# Patient Record
Sex: Male | Born: 1995
Health system: Southern US, Community
[De-identification: ages and names within clinical notes are randomized; demographics above are authoritative.]

## PROBLEM LIST (undated history)

## (undated) DIAGNOSIS — K649 Unspecified hemorrhoids: Secondary | ICD-10-CM

## (undated) DIAGNOSIS — K602 Anal fissure, unspecified: Secondary | ICD-10-CM

## (undated) DIAGNOSIS — Z789 Other specified health status: Secondary | ICD-10-CM

## (undated) DIAGNOSIS — L309 Dermatitis, unspecified: Secondary | ICD-10-CM

## (undated) HISTORY — DX: Dermatitis, unspecified: L30.9

## (undated) HISTORY — DX: Other specified health status: Z78.9

## (undated) HISTORY — DX: Anal fissure, unspecified: K60.2

## (undated) HISTORY — DX: Unspecified hemorrhoids: K64.9

---
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
## 2017-06-15 DIAGNOSIS — B356 Tinea cruris: Secondary | ICD-10-CM | POA: Diagnosis not present

## 2017-12-18 DIAGNOSIS — L304 Erythema intertrigo: Secondary | ICD-10-CM | POA: Diagnosis not present

## 2017-12-18 DIAGNOSIS — L700 Acne vulgaris: Secondary | ICD-10-CM | POA: Diagnosis not present

## 2017-12-18 DIAGNOSIS — L299 Pruritus, unspecified: Secondary | ICD-10-CM | POA: Diagnosis not present

## 2018-06-04 DIAGNOSIS — L219 Seborrheic dermatitis, unspecified: Secondary | ICD-10-CM | POA: Diagnosis not present

## 2018-07-16 DIAGNOSIS — R5383 Other fatigue: Secondary | ICD-10-CM | POA: Diagnosis not present

## 2018-07-16 DIAGNOSIS — K5909 Other constipation: Secondary | ICD-10-CM | POA: Diagnosis not present

## 2018-07-16 DIAGNOSIS — Z1331 Encounter for screening for depression: Secondary | ICD-10-CM | POA: Diagnosis not present

## 2018-07-16 DIAGNOSIS — K921 Melena: Secondary | ICD-10-CM | POA: Diagnosis not present

## 2018-07-16 DIAGNOSIS — R42 Dizziness and giddiness: Secondary | ICD-10-CM | POA: Diagnosis not present

## 2018-09-17 ENCOUNTER — Encounter: Payer: Self-pay | Admitting: Gastroenterology

## 2018-11-12 ENCOUNTER — Other Ambulatory Visit (INDEPENDENT_AMBULATORY_CARE_PROVIDER_SITE_OTHER): Payer: BLUE CROSS/BLUE SHIELD

## 2018-11-12 ENCOUNTER — Encounter: Payer: Self-pay | Admitting: Gastroenterology

## 2018-11-12 ENCOUNTER — Ambulatory Visit (INDEPENDENT_AMBULATORY_CARE_PROVIDER_SITE_OTHER): Payer: BLUE CROSS/BLUE SHIELD | Admitting: Gastroenterology

## 2018-11-12 VITALS — BP 110/68 | HR 62 | Ht 68.0 in | Wt 152.1 lb

## 2018-11-12 DIAGNOSIS — R748 Abnormal levels of other serum enzymes: Secondary | ICD-10-CM

## 2018-11-12 DIAGNOSIS — R21 Rash and other nonspecific skin eruption: Secondary | ICD-10-CM

## 2018-11-12 DIAGNOSIS — R194 Change in bowel habit: Secondary | ICD-10-CM

## 2018-11-12 DIAGNOSIS — K602 Anal fissure, unspecified: Secondary | ICD-10-CM

## 2018-11-12 DIAGNOSIS — H1045 Other chronic allergic conjunctivitis: Secondary | ICD-10-CM | POA: Diagnosis not present

## 2018-11-12 LAB — CBC WITH DIFFERENTIAL/PLATELET
BASOS ABS: 0 10*3/uL (ref 0.0–0.1)
BASOS PCT: 0.7 % (ref 0.0–3.0)
EOS ABS: 0.2 10*3/uL (ref 0.0–0.7)
Eosinophils Relative: 4.2 % (ref 0.0–5.0)
HEMATOCRIT: 49.2 % (ref 39.0–52.0)
HEMOGLOBIN: 16.6 g/dL (ref 13.0–17.0)
LYMPHS PCT: 34 % (ref 12.0–46.0)
Lymphs Abs: 1.9 10*3/uL (ref 0.7–4.0)
MCHC: 33.8 g/dL (ref 30.0–36.0)
MCV: 87.8 fl (ref 78.0–100.0)
MONOS PCT: 10.9 % (ref 3.0–12.0)
Monocytes Absolute: 0.6 10*3/uL (ref 0.1–1.0)
Neutro Abs: 2.8 10*3/uL (ref 1.4–7.7)
Neutrophils Relative %: 50.2 % (ref 43.0–77.0)
Platelets: 283 10*3/uL (ref 150.0–400.0)
RBC: 5.6 Mil/uL (ref 4.22–5.81)
RDW: 13.3 % (ref 11.5–15.5)
WBC: 5.6 10*3/uL (ref 4.0–10.5)

## 2018-11-12 LAB — HEPATIC FUNCTION PANEL
ALT: 44 U/L (ref 0–53)
AST: 34 U/L (ref 0–37)
Albumin: 4.8 g/dL (ref 3.5–5.2)
Alkaline Phosphatase: 54 U/L (ref 39–117)
BILIRUBIN DIRECT: 0.1 mg/dL (ref 0.0–0.3)
BILIRUBIN TOTAL: 0.6 mg/dL (ref 0.2–1.2)
TOTAL PROTEIN: 7.2 g/dL (ref 6.0–8.3)

## 2018-11-12 MED ORDER — HYDROCORTISONE 1 % EX OINT: 1.0000 "application " | TOPICAL_OINTMENT | Freq: Two times a day (BID) | CUTANEOUS | 0 refills | Status: DC

## 2018-11-12 MED ORDER — AMBULATORY NON FORMULARY MEDICATION: 0 refills | Status: DC

## 2018-11-12 MED ORDER — METHYLCELLULOSE (LAXATIVE) PO POWD: ORAL | Status: DC

## 2018-11-12 NOTE — Patient Instructions (Addendum)
If you are age 21 or older, your body mass index should be between 23-30. Your Body mass index is 23.13 kg/m. If this is out of the aforementioned range listed, please consider follow up with your Primary Care Provider.  If you are age 57 or younger, your body mass index should be between 19-25. Your Body mass index is 23.13 kg/m. If this is out of the aformentioned range listed, please consider follow up with your Primary Care Provider.   Use a daily fiber supplement such as Citrucel, over the counter.  Purchase hydrocortisone 1% cream over the counter and use twice a day.  Follow up with Dermatology.  We have sent a prescription for nitroglycerin 0.125% gel to [facility]. You should apply a pea size amount to your rectum three times daily x 6-8 weeks.  [facility]'s information is below: Address: [address] [place], [place] [address]  [phone]  *Please DO NOT go directly from our office to pick up this medication! Give the pharmacy 1 day to process the prescription as this is compounded at takes time to make.  Please go to the lab in the basement of our building to have lab work done as you leave today. Hit "B" for basement when you get on the elevator.  When the doors open the lab is on your left.  We will call you with the results. Thank you.  Call us in 2 weeks if not improved.   Thank you for entrusting me with your care and for choosing [facility], Dr. Esmond Cellar

## 2018-11-12 NOTE — Progress Notes (Signed)
HPI :  22 y/o male with no significant medical history referred by Charlott Holler FNP for symptoms of rectal bleeding and constipation. He is accompanied by his father today. Labs from July 31st done for this issue - Hgb 14.2, ferritin of 89.  AST of 84, ALT 86, 65, T bil 0.7 WBC 4.8, platelets 276, normal renal function  He reports for the past year he has been having some scant blood with bowel movement. More recently this has occurred with most of his bowel movements, small amount of bright red blood, he also has associated discomfort in his anal canal with this when passing his stools. His stool is sometimes hard, sometimes normal, sometimes soft. He's had constipation in the past for which she is used some Colace. He has some occasional rare lower abdominal discomfort unclear if related to a bowel movement. He denies any nausea or vomiting, no weight loss, no new medications. He reports he's been seen by dermatology for blistering skin reaction at times, unclear over the past few years what is been causing that, also associated is a rash he has had on his lower back and buttocks. He denies any dietary changes. 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 He denies any family history of colon cancer or IBD. He has never had a prior colonoscopy.   Of note on routine lab work over the summer he was noted to have AST and ALT elevation. He denies any family history of liver disease. No personal history of liver disease. No history of jaundice. He does not drink any alcohol. No tobacco use. He is otherwise healthy and other than an appendectomy as a child has no significant surgical history.    Past Medical History:  Diagnosis Date  . No pertinent past surgical history      Past Surgical History:  Procedure Laterality Date  . APPENDECTOMY  2006   Family History  Problem Relation Age of Onset  . Hypertension Mother   . Hyperlipidemia Mother   . Thyroid disease Mother   .  Hypertension Father   . Non-Hodgkin's lymphoma Paternal Uncle    Social History   Tobacco Use  . Smoking status: Never Smoker  . Smokeless tobacco: Never Used  Substance Use Topics  . Alcohol use: Never    Frequency: Never  . Drug use: Never   Current Outpatient Medications  Medication Sig Dispense Refill  . Multiple Vitamin (MULTIVITAMIN) tablet Take 1 tablet by mouth daily.    . AMBULATORY NON FORMULARY MEDICATION Nitroglycerine ointment 0.125 %  Apply a pea sized amount internally two - three times daily for several weeks or until healed Dispense 30 GM zero refill 30 g 0  . hydrocortisone 1 % ointment Apply 1 application topically 2 (two) times daily. 30 g 0  . methylcellulose (CITRUCEL) oral powder Use daily  As directed     No current facility-administered medications for this visit.    No Known Allergies   Review of Systems: All systems reviewed and negative except where noted in HPI.    No results found.  Physical Exam: BP 110/68   Pulse 62   Ht 5\' 8"  (1.727 m)   Wt 152 lb 2 oz (69 kg)   BMI 23.13 kg/m  Constitutional: Pleasant,well-developed, male in no acute distress. HEENT: Normocephalic and atraumatic. Conjunctivae are normal. No scleral icterus. Neck supple.  Cardiovascular: Normal rate, regular rhythm.  Pulmonary/chest: Effort normal and breath sounds normal. No wheezing, rales or  rhonchi. Abdominal: Soft, nondistended, nontender. . There are no masses palpable. No hepatomegaly. DRE - moderate posterior midline anal fissure, normal DRE otherwise - anoscopy deferred due to fissure and pain. Eczematous rash on lower back and buttocks Extremities: no edema Lymphadenopathy: No cervical adenopathy noted. Neurological: Alert and oriented to person place and time. Skin: Skin is warm and dry. No rashes noted. Psychiatric: Normal mood and affect. Behavior is normal.   ASSESSMENT AND PLAN: 22 y/o male here for new patient assessment of the following  issues:  Anal Fissure / altered bowel habits - rectal discomfort with bleeding for the past year. His CBC is normal. On exam he has an obvious anal fissure which correlates to location of his pain has suspect this is the very likely cause for his bleeding. Anoscopy was deferred today in light of this finding and his pain with palpation. Recommending he use a daily fiber supplement to help keep stool soft and avoid time spent on the toilet. We'll treat the fissure with topical 0.125% nitroglycerin ointment 2 to 3 times a day for the next few weeks or until healed. I will recheck a CBC today to ensure its remained stable. I asked him to contact me in a few weeks and let me know how he is doing. If he fails to have improvement of symptoms but the fissure resolved, would then consider colonoscopy. I suspect therapy however will likely resolve his symptoms. He verbalizes understanding to contact me if he is not better.  Elevated liver enzymes - one time elevation over the summer. I will repeat LFTs at this time and see if elevation persists if it does, he will warrant further serologic workup and ultrasound imaging. He agreed with the plan  Rash - appears to have an eczematous rash on his lower back and buttocks area. No obvious blisters which he reports in the past. He has follow with dermatology for this. We'll try some topical 1% hydrocortisone over-the-counter for this and recommend he follow up with dermatology for further assessment.  I explained all of these issues with the patient and his father today who verbalized understanding, I answered their questions.  Parkline Cellar, MD [facility]  CC: Charlott Holler FNP

## 2018-12-04 DIAGNOSIS — L300 Nummular dermatitis: Secondary | ICD-10-CM | POA: Diagnosis not present

## 2018-12-04 DIAGNOSIS — L299 Pruritus, unspecified: Secondary | ICD-10-CM | POA: Diagnosis not present

## 2019-01-18 ENCOUNTER — Telehealth: Payer: Self-pay | Admitting: Gastroenterology

## 2019-01-18 DIAGNOSIS — K625 Hemorrhage of anus and rectum: Secondary | ICD-10-CM

## 2019-01-18 DIAGNOSIS — R194 Change in bowel habit: Secondary | ICD-10-CM

## 2019-01-18 NOTE — Telephone Encounter (Signed)
Called and spoke to pt's father.  Pt is at college in [place] having BR blood in toilet. He is taking the Citrucel daily and using Nitroglycerin several times a day as directed at his last office visit. He is having stabbing abdominal pain. Spoke to Dr. Loni Muse.  He is recommending a colonoscopy. I direct-booked him for a colonoscopy next Wednesday. Jimmy Wells, the father, will come in on Monday to go through pt's prep instructions and pick up a sample prep. Father understands that Jimmy Wells should be seen locally if his condition worsens over the weekend.

## 2019-01-18 NOTE — Telephone Encounter (Signed)
Agree with assessment and plan. He is being treated for fissure with niroglycerin and symptoms have persisted. Recommend colonoscopy to further evaluate. Thanks for coordinating.

## 2019-01-18 NOTE — Telephone Encounter (Signed)
PT father called advised that son is still having GI sympotomes and though if there was another med he could maybe take... would like to talk to someone.Marland KitchenMarland KitchenMarland Kitchen

## 2019-01-21 NOTE — Telephone Encounter (Signed)
Father, Jimmy Wells came in and was instructed. Sample of Suprep given.  Answered all questions.

## 2019-01-23 ENCOUNTER — Encounter: Payer: BLUE CROSS/BLUE SHIELD | Admitting: Gastroenterology

## 2019-01-23 ENCOUNTER — Encounter: Payer: Self-pay | Admitting: Gastroenterology

## 2019-01-23 ENCOUNTER — Ambulatory Visit (AMBULATORY_SURGERY_CENTER): Payer: BLUE CROSS/BLUE SHIELD | Admitting: Gastroenterology

## 2019-01-23 VITALS — BP 106/68 | HR 66 | Temp 98.0°F | Resp 11 | Ht 68.0 in | Wt 152.0 lb

## 2019-01-23 DIAGNOSIS — K648 Other hemorrhoids: Secondary | ICD-10-CM

## 2019-01-23 DIAGNOSIS — D125 Benign neoplasm of sigmoid colon: Secondary | ICD-10-CM | POA: Diagnosis not present

## 2019-01-23 DIAGNOSIS — K635 Polyp of colon: Secondary | ICD-10-CM | POA: Diagnosis not present

## 2019-01-23 DIAGNOSIS — K625 Hemorrhage of anus and rectum: Secondary | ICD-10-CM

## 2019-01-23 MED ORDER — SODIUM CHLORIDE 0.9 % IV SOLN: 500.0000 mL | Freq: Once | INTRAVENOUS | Status: DC

## 2019-01-23 NOTE — Op Note (Signed)
[facility] Patient Name: Jimmy Wells Procedure Date: 01/23/2019 11:03 AM MRN: [id number] Endoscopist: Remo Lipps P. Havery Moros , MD Age: 23 Referring MD:  Date of Birth: 04-26-1996 Gender: Male Account #: [id number] Procedure:                Colonoscopy Indications:              Rectal bleeding Medicines:                Monitored Anesthesia Care Procedure:                Pre-Anesthesia Assessment:                           - Prior to the procedure, a History and Physical                            was performed, and patient medications and                            allergies were reviewed. The patient's tolerance of                            previous anesthesia was also reviewed. The risks                            and benefits of the procedure and the sedation                            options and risks were discussed with the patient.                            All questions were answered, and informed consent                            was obtained. Prior Anticoagulants: The patient has                            taken no previous anticoagulant or antiplatelet                            agents. ASA Grade Assessment: I - A normal, healthy                            patient. After reviewing the risks and benefits,                            the patient was deemed in satisfactory condition to                            undergo the procedure.                           After obtaining informed consent, the colonoscope  was passed under direct vision. Throughout the                            procedure, the patient's blood pressure, pulse, and                            oxygen saturations were monitored continuously. The                            Colonoscope was introduced through the anus and                            advanced to the the terminal ileum, with                            identification of the appendiceal orifice and IC              valve. The colonoscopy was performed without                            difficulty. The patient tolerated the procedure                            well. The quality of the bowel preparation was                            good. The terminal ileum, ileocecal valve,                            appendiceal orifice, and rectum were photographed. Scope In: 11:10:02 AM Scope Out: 11:33:23 AM Scope Withdrawal Time: 0 hours 17 minutes 36 seconds  Total Procedure Duration: 0 hours 23 minutes 21 seconds  Findings:                 An anal fissure was found on perianal exam.                           The terminal ileum appeared normal.                           A 3 mm polyp was found in the sigmoid colon. The                            polyp was flat. The polyp was removed with a cold                            snare. Resection and retrieval were complete.                           The colon was quite tortuous.                           Internal hemorrhoids were found during  retroflexion. The hemorrhoids were small.                           The exam was otherwise without abnormality. Complications:            No immediate complications. Estimated blood loss:                            Minimal. Estimated Blood Loss:     Estimated blood loss was minimal. Impression:               - Anal fissure found on perianal exam.                           - The examined portion of the ileum was normal.                           - One 3 mm polyp in the sigmoid colon, removed with                            a cold snare. Resected and retrieved.                           - Tortuous colon.                           - Internal hemorrhoids.                           - The examination was otherwise normal.                           Overall, suspect fissure is the cause of patient's                            symptoms, +/- internal hemorrhoids. Recommendation:           - Patient has  a contact number available for                            emergencies. The signs and symptoms of potential                            delayed complications were discussed with the                            patient. Return to normal activities tomorrow.                            Written discharge instructions were provided to the                            patient.                           - Resume previous diet.                           -  Continue present medications.                           - Topical nitroglycerin 0.125% applied three times                            daily if not already doing that                           - Daily fiber supplement                           - Call if no improvement in symptoms                           - Await pathology results. Remo Lipps P. Havery Moros, MD 01/23/2019 11:38:58 AM This report has been signed electronically.

## 2019-01-23 NOTE — Progress Notes (Signed)
A/ox3, pleased with MAC, report to RN 

## 2019-01-23 NOTE — Patient Instructions (Signed)
Handouts given on polyps and hemorrhoids   YOU HAD AN ENDOSCOPIC PROCEDURE TODAY AT THE [facility]:   Refer to the procedure report that was given to you for any specific questions about what was found during the examination.  If the procedure report does not answer your questions, please call your gastroenterologist to clarify.  If you requested that your care partner not be given the details of your procedure findings, then the procedure report has been included in a sealed envelope for you to review at your convenience later.  YOU SHOULD EXPECT: Some feelings of bloating in the abdomen. Passage of more gas than usual.  Walking can help get rid of the air that was put into your GI tract during the procedure and reduce the bloating. If you had a lower endoscopy (such as a colonoscopy or flexible sigmoidoscopy) you may notice spotting of blood in your stool or on the toilet paper. If you underwent a bowel prep for your procedure, you may not have a normal bowel movement for a few days.  Please Note:  You might notice some irritation and congestion in your nose or some drainage.  This is from the oxygen used during your procedure.  There is no need for concern and it should clear up in a day or so.  SYMPTOMS TO REPORT IMMEDIATELY:   Following lower endoscopy (colonoscopy or flexible sigmoidoscopy):  Excessive amounts of blood in the stool  Significant tenderness or worsening of abdominal pains  Swelling of the abdomen that is new, acute  Fever of 100F or higher    For urgent or emergent issues, a gastroenterologist can be reached at any hour by calling [phone].   DIET:  We do recommend a small meal at first, but then you may proceed to your regular diet.  Drink plenty of fluids but you should avoid alcoholic beverages for 24 hours.  ACTIVITY:  You should plan to take it easy for the rest of today and you should NOT DRIVE or use heavy machinery until tomorrow (because of  the sedation medicines used during the test).    FOLLOW UP: Our staff will call the number listed on your records the next business day following your procedure to check on you and address any questions or concerns that you may have regarding the information given to you following your procedure. If we do not reach you, we will leave a message.  However, if you are feeling well and you are not experiencing any problems, there is no need to return our call.  We will assume that you have returned to your regular daily activities without incident.  If any biopsies were taken you will be contacted by phone or by letter within the next 1-3 weeks.  Please call us at [phone] if you have not heard about the biopsies in 3 weeks.    SIGNATURES/CONFIDENTIALITY: You and/or your care partner have signed paperwork which will be entered into your electronic medical record.  These signatures attest to the fact that that the information above on your After Visit Summary has been reviewed and is understood.  Full responsibility of the confidentiality of this discharge information lies with you and/or your care-partner. 

## 2019-01-23 NOTE — Progress Notes (Signed)
Called to room to assist during endoscopic procedure.  Patient ID and intended procedure confirmed with present staff. Received instructions for my participation in the procedure from the performing physician.  

## 2019-01-24 ENCOUNTER — Telehealth: Payer: Self-pay

## 2019-01-24 NOTE — Telephone Encounter (Signed)
  Follow up Call-  Call back number 01/23/2019  Post procedure Call Back phone  # [phone]  Permission to leave phone message Yes  Some recent data might be hidden     Patient questions:  Do you have a fever, pain , or abdominal swelling? No. Pain Score  0 *  Have you tolerated food without any problems? Yes.    Have you been able to return to your normal activities? Yes.    Do you have any questions about your discharge instructions: Diet   No. Medications  No. Follow up visit  No.  Do you have questions or concerns about your Care? No.  Actions: * If pain score is 4 or above: No action needed, pain <4.

## 2019-02-02 ENCOUNTER — Encounter: Payer: Self-pay | Admitting: Gastroenterology

## 2019-02-08 ENCOUNTER — Ambulatory Visit: Payer: BLUE CROSS/BLUE SHIELD | Admitting: Gastroenterology

## 2019-02-08 DIAGNOSIS — B353 Tinea pedis: Secondary | ICD-10-CM | POA: Diagnosis not present

## 2019-08-22 DIAGNOSIS — R0602 Shortness of breath: Secondary | ICD-10-CM | POA: Diagnosis not present

## 2019-08-22 DIAGNOSIS — Z1159 Encounter for screening for other viral diseases: Secondary | ICD-10-CM | POA: Diagnosis not present

## 2019-09-17 DIAGNOSIS — L299 Pruritus, unspecified: Secondary | ICD-10-CM | POA: Diagnosis not present

## 2019-09-17 DIAGNOSIS — L300 Nummular dermatitis: Secondary | ICD-10-CM | POA: Diagnosis not present

## 2019-11-20 DIAGNOSIS — M79674 Pain in right toe(s): Secondary | ICD-10-CM | POA: Diagnosis not present

## 2019-11-20 DIAGNOSIS — Z6826 Body mass index (BMI) 26.0-26.9, adult: Secondary | ICD-10-CM | POA: Diagnosis not present

## 2019-11-20 DIAGNOSIS — Z2821 Immunization not carried out because of patient refusal: Secondary | ICD-10-CM | POA: Diagnosis not present

## 2019-12-11 ENCOUNTER — Ambulatory Visit (INDEPENDENT_AMBULATORY_CARE_PROVIDER_SITE_OTHER): Payer: BC Managed Care – PPO | Admitting: Gastroenterology

## 2019-12-11 ENCOUNTER — Encounter: Payer: Self-pay | Admitting: Gastroenterology

## 2019-12-11 VITALS — BP 112/58 | HR 64 | Temp 97.9°F | Ht 67.5 in | Wt 171.2 lb

## 2019-12-11 DIAGNOSIS — K602 Anal fissure, unspecified: Secondary | ICD-10-CM

## 2019-12-11 MED ORDER — NONFORMULARY OR COMPOUNDED ITEM: 1 refills | Status: DC

## 2019-12-11 NOTE — Patient Instructions (Signed)
If you are age 22 or older, your body mass index should be between 23-30. Your Body mass index is 26.42 kg/m. If this is out of the aforementioned range listed, please consider follow up with your Primary Care Provider.  If you are age 14 or younger, your body mass index should be between 19-25. Your Body mass index is 26.42 kg/m. If this is out of the aformentioned range listed, please consider follow up with your Primary Care Provider.   We have sent the following medications to your pharmacy for you to pick up at your convenience: Diltiazem Gel/Lidocaine compound - [facility].

## 2019-12-11 NOTE — Progress Notes (Signed)
HPI :  23 year old male here for follow-up visit.  I have seen him in the past for rectal bleeding which has been thought to be due to anal fissure.  Since her last visit he underwent a colonoscopy on 01/23/2019 as below   Colonoscopy - 01/23/19  - An anal fissure was found on perianal exam. - The terminal ileum appeared normal. - A 3 mm polyp was found in the sigmoid colon. The polyp was flat. The polyp was removed with a cold snare. Resection and retrieval were complete. - HYPERPLASTIC - The colon was quite tortuous. - Internal hemorrhoids were found during retroflexion. The hemorrhoids were small. - The exam was otherwise without abnormality.  Overall at the time it was thought that his symptoms were due to persistent anal fissure.  I had recommended topical nitroglycerin ointment and the use of fiber supplement such as Citrucel to keep the stool soft. He use the nitroglycerin in its entirety, he has been maintaining his use of Citrucel and Colace to keep the stools soft.  He denies any straining and constipation and in general the symptoms are much better than they had been in the previous year.  He continues to have episodes of the fissure flaring up about 3-4 times a year.  The last time was about a month ago, he had a few bowel movements with some blood and perianal pain.  He denies any abdominal pains otherwise.  He asked about long-term management options.  Otherwise feels well today without complaints     Past Medical History:  Diagnosis Date  . Anal fissure   . Eczema   . Hemorrhoids      Past Surgical History:  Procedure Laterality Date  . APPENDECTOMY  2006   Family History  Problem Relation Age of Onset  . Hypertension Mother   . Hyperlipidemia Mother   . Thyroid disease Mother   . Hypertension Father   . Non-Hodgkin's lymphoma Paternal Uncle    Social History   Tobacco Use  . Smoking status: Never Smoker  . Smokeless tobacco: Never Used  Substance Use Topics   . Alcohol use: Never  . Drug use: Never   Current Outpatient Medications  Medication Sig Dispense Refill  . Docusate Sodium (COLACE PO) Take 1 tablet by mouth as needed.    . EUCRISA 2 % OINT Apply 1 application topically 2 (two) times daily.    . Multiple Vitamin (MULTIVITAMIN) tablet Take 1 tablet by mouth daily.    . NONFORMULARY OR COMPOUNDED ITEM Diltiazem 2%:Lidocaine 5% compound - Apply a pea size amount to rectal opening three times daily for 6 weeks. 1 each 1   No current facility-administered medications for this visit.   No Known Allergies   Review of Systems: All systems reviewed and negative except where noted in HPI.   Lab Results  Component Value Date   WBC 5.6 11/12/2018   HGB 16.6 11/12/2018   HCT 49.2 11/12/2018   MCV 87.8 11/12/2018   PLT 283.0 11/12/2018    No results found for: CREATININE, BUN, NA, K, CL, CO2  Lab Results  Component Value Date   ALT 44 11/12/2018   AST 34 11/12/2018   ALKPHOS 54 11/12/2018   BILITOT 0.6 11/12/2018     Physical Exam: BP (!) 112/58   Pulse 64   Temp 97.9 F (36.6 C)   Ht 5' 7.5" (1.715 m)   Wt 171 lb 3.2 oz (77.7 kg)   BMI 26.42 kg/m  Constitutional:  Pleasant,well-developed, male in no acute distress. HEENT: Normocephalic and atraumatic. Conjunctivae are normal. No scleral icterus. Neck supple.  Cardiovascular: Normal rate, regular rhythm.  Pulmonary/chest: Effort normal and breath sounds normal. No wheezing, rales or rhonchi. Abdominal: Soft, nondistended, nontender. There are no masses palpable. No hepatomegaly. DRE - Eros standby - small posterior midline anal fissure - mostly healed, improved from when last seen. Internal DRE not performed Extremities: no edema Lymphadenopathy: No cervical adenopathy noted. Neurological: Alert and oriented to person place and time. Skin: Skin is warm and dry. No rashes noted. Psychiatric: Normal mood and affect. Behavior is normal.   ASSESSMENT AND  PLAN: 23 year old male here for follow-up visit:  Anal fissure - I believe this is most likely the cause for his symptoms.  He has had recurrent symptoms periodically, although over the past year is much better controlled with fiber supplementation compared to the previous year.  He has a very small, mostly healed fissure where he has had a previously noted fissure in the posterior midline anal canal on exam today.  I discussed options with him.  We will try course of diltiazem/lidocaine ointment to use 3 times a day for the next 6 weeks see if we can completely heal the fissure (previously tried nitroglycerin).  He should continue his fiber supplementation to keep stools soft and prevent straining.  Management thereafter will be based on how often this bothers him.  If he has symptoms very rarely and normally does well with just fiber then I would continue the regimen and hopefully nothing else is needed.  If he has recurrence of symptoms frequently that severely bother him despite medical management then we will need to consider a surgical evaluation.  We discussed this and he wants to hold off for now, wants to avoid surgery if at all possible which I agree with.  We will see how he does.  Moving forward again he will let me know if symptoms persist.  I do think the fissure is the source of his symptoms, his hemorrhoids were small and I think less likely to be causing his pain and bleeding.  We discussed that hemorrhoid banding could make the fissures worse and I would hold off on that for now.  He agreed and can follow-up as needed  Kersey Cellar, MD [facility]

## 2020-07-14 ENCOUNTER — Ambulatory Visit (INDEPENDENT_AMBULATORY_CARE_PROVIDER_SITE_OTHER): Payer: BC Managed Care – PPO | Admitting: Legal Medicine

## 2020-07-14 ENCOUNTER — Other Ambulatory Visit: Payer: Self-pay

## 2020-07-14 ENCOUNTER — Encounter: Payer: Self-pay | Admitting: Legal Medicine

## 2020-07-14 DIAGNOSIS — N50811 Right testicular pain: Secondary | ICD-10-CM | POA: Diagnosis not present

## 2020-07-14 DIAGNOSIS — N4400 Torsion of testis, unspecified: Secondary | ICD-10-CM | POA: Diagnosis not present

## 2020-07-14 DIAGNOSIS — R1031 Right lower quadrant pain: Secondary | ICD-10-CM | POA: Insufficient documentation

## 2020-07-14 DIAGNOSIS — N433 Hydrocele, unspecified: Secondary | ICD-10-CM | POA: Diagnosis not present

## 2020-07-14 LAB — POCT URINALYSIS DIP (CLINITEK)
Bilirubin, UA: NEGATIVE
Blood, UA: NEGATIVE
Glucose, UA: NEGATIVE mg/dL
Ketones, POC UA: NEGATIVE mg/dL
Leukocytes, UA: NEGATIVE
Nitrite, UA: NEGATIVE
POC PROTEIN,UA: NEGATIVE
Spec Grav, UA: 1.01 (ref 1.010–1.025)
Urobilinogen, UA: 0.2 E.U./dL
pH, UA: 6.5 (ref 5.0–8.0)

## 2020-07-14 NOTE — Progress Notes (Signed)
Subjective:  Patient ID: Jimmy Wells, male    DOB: 1996-07-23  Age: 24 y.o. MRN: [id number]  Chief Complaint  Patient presents with  . Testicle Pain    HPI: one week ago he has severe right testicular pain but went away.  He started same pain this AM for a few hours. He has nausea, no vomiting.  He is not doubled over in pain.  He lifts at gym up to 80 lbs.and 80 to 100 reps. He denies and dysuria or frequency.  No testicular trauma.   Current Outpatient Medications on File Prior to Visit  Medication Sig Dispense Refill  . Docusate Sodium (COLACE PO) Take 1 tablet by mouth as needed.    . EUCRISA 2 % OINT Apply 1 application topically 2 (two) times daily.    . Multiple Vitamin (MULTIVITAMIN) tablet Take 1 tablet by mouth daily.     No current facility-administered medications on file prior to visit.   Past Medical History:  Diagnosis Date  . Anal fissure   . Eczema   . Hemorrhoids    Past Surgical History:  Procedure Laterality Date  . APPENDECTOMY  2006    Family History  Problem Relation Age of Onset  . Hypertension Mother   . Hyperlipidemia Mother   . Thyroid disease Mother   . Hypertension Father   . Non-Hodgkin's lymphoma Paternal Uncle    Social History   Socioeconomic History  . Marital status: Single    Spouse name: Not on file  . Number of children: Not on file  . Years of education: Not on file  . Highest education level: Not on file  Occupational History  . Occupation: Student    Comment: [employer]  Tobacco Use  . Smoking status: Never Smoker  . Smokeless tobacco: Never Used  Vaping Use  . Vaping Use: Never used  Substance and Sexual Activity  . Alcohol use: Never  . Drug use: Never  . Sexual activity: Not on file  Other Topics Concern  . Not on file  Social History Narrative  . Not on file   Social Determinants of Health   Financial Resource Strain:   . Difficulty of Paying Living Expenses:   Food Insecurity:   . Worried About Paediatric nurse in the Last Year:   . Arboriculturist in the Last Year:   Transportation Needs:   . Film/video editor (Medical):   Marland Kitchen Lack of Transportation (Non-Medical):   Physical Activity:   . Days of Exercise per Week:   . Minutes of Exercise per Session:   Stress:   . Feeling of Stress :   Social Connections:   . Frequency of Communication with Friends and Family:   . Frequency of Social Gatherings with Friends and Family:   . Attends Religious Services:   . Active Member of Clubs or Organizations:   . Attends Archivist Meetings:   Marland Kitchen Marital Status:     Review of Systems  Constitutional: Negative.   HENT: Negative.   Eyes: Negative.   Respiratory: Negative.   Cardiovascular: Negative.   Genitourinary: Positive for testicular pain.     Objective:  BP 122/80   Pulse 83   Temp (!) 97.5 F (36.4 C)   Resp 16   Ht 5\' 7"  (1.702 m)   Wt 175 lb 12.8 oz (79.7 kg)   SpO2 98%   BMI 27.53 kg/m   BP/Weight 07/14/2020 30/06/6225 02/19/3544  Systolic BP  409 735 329  Diastolic BP 80 58 68  Wt. (Lbs) 175.8 171.2 152  BMI 27.53 26.42 23.11    Physical Exam Vitals reviewed.  Constitutional:      Appearance: Normal appearance.  Eyes:     Extraocular Movements: Extraocular movements intact.     Conjunctiva/sclera: Conjunctivae normal.     Pupils: Pupils are equal, round, and reactive to light.  Cardiovascular:     Rate and Rhythm: Normal rate and regular rhythm.     Pulses: Normal pulses.     Heart sounds: Normal heart sounds.  Abdominal:     General: Abdomen is flat. Bowel sounds are normal.     Tenderness: There is abdominal tenderness.  Genitourinary:    Penis: Normal.      Prostate: Normal.     Rectum: Normal.     Comments: Right testicle painful but no masses Musculoskeletal:     Cervical back: Normal range of motion and neck supple.  Skin:    General: Skin is warm.  Neurological:     General: No focal deficit present.     Mental Status: He is  alert and oriented to person, place, and time.       Lab Results  Component Value Date   WBC 5.6 11/12/2018   HGB 16.6 11/12/2018   HCT 49.2 11/12/2018   PLT 283.0 11/12/2018   ALT 44 11/12/2018   AST 34 11/12/2018      Assessment & Plan:   1. Testicular torsion - Ambulatory referral to Urology - US SCROTUM Patient has right testicular pain but I do not see torsion but needs ultrasound to test for intermittant testicular injury  2. Right inguinal pain Patient is weight lifter and has inguinal pain - possible inguinal hernia      Orders Placed This Encounter  Procedures  . US SCROTUM  . Ambulatory referral to Urology     Follow-up: Return if symptoms worsen or fail to improve.  An After Visit Summary was printed and given to the patient.  [place] [phone]

## 2020-09-25 DIAGNOSIS — Z03818 Encounter for observation for suspected exposure to other biological agents ruled out: Secondary | ICD-10-CM | POA: Diagnosis not present

## 2020-12-08 DIAGNOSIS — Z6827 Body mass index (BMI) 27.0-27.9, adult: Secondary | ICD-10-CM | POA: Diagnosis not present

## 2020-12-08 DIAGNOSIS — M546 Pain in thoracic spine: Secondary | ICD-10-CM | POA: Diagnosis not present

## 2020-12-08 DIAGNOSIS — R61 Generalized hyperhidrosis: Secondary | ICD-10-CM | POA: Diagnosis not present

## 2020-12-08 DIAGNOSIS — R531 Weakness: Secondary | ICD-10-CM | POA: Diagnosis not present

## 2021-04-05 DIAGNOSIS — J9801 Acute bronchospasm: Secondary | ICD-10-CM | POA: Diagnosis not present

## 2021-04-05 DIAGNOSIS — Z20828 Contact with and (suspected) exposure to other viral communicable diseases: Secondary | ICD-10-CM | POA: Diagnosis not present

## 2021-04-05 DIAGNOSIS — J189 Pneumonia, unspecified organism: Secondary | ICD-10-CM | POA: Diagnosis not present

## 2021-04-07 ENCOUNTER — Ambulatory Visit: Payer: BC Managed Care – PPO | Admitting: Gastroenterology

## 2021-05-11 ENCOUNTER — Other Ambulatory Visit: Payer: Self-pay

## 2021-05-11 ENCOUNTER — Ambulatory Visit: Payer: BC Managed Care – PPO | Admitting: Gastroenterology

## 2021-05-11 ENCOUNTER — Encounter: Payer: Self-pay | Admitting: Gastroenterology

## 2021-05-11 VITALS — BP 102/60 | HR 68 | Ht 66.5 in | Wt 176.1 lb

## 2021-05-11 DIAGNOSIS — K6289 Other specified diseases of anus and rectum: Secondary | ICD-10-CM

## 2021-05-11 DIAGNOSIS — L309 Dermatitis, unspecified: Secondary | ICD-10-CM

## 2021-05-11 DIAGNOSIS — Z8719 Personal history of other diseases of the digestive system: Secondary | ICD-10-CM | POA: Diagnosis not present

## 2021-05-11 MED ORDER — NONFORMULARY OR COMPOUNDED ITEM: 1 refills | Status: DC

## 2021-05-11 NOTE — Progress Notes (Signed)
HPI :  25 year old male here for follow-up visit.  He has been followed in the past for history of rectal bleeding attributed to anal fissure.  He had a colonoscopy with me in 2020 showing an anal fissure and internal hemorrhoids.  He has been treated in the past with topical nitroglycerin but that was switched to topical diltiazem ointment when symptoms persisted.  He has also been on Colace and Citrucel in the past as well.  I have not seen him since December 2020.  He states he completed the course of diltiazem ointment after her last visit and was doing well for a while.  He has not had problems with straining or problems with his bowels.  About 2 months ago he has noticed some drainage and mucus leaking from his rectum especially when he passes gas.  This is been staining his underwear.  He also has had some blood on the toilet paper with wiping and some discomfort in his perianal area at times.  He wonders if he has an infection there.  He denies any straining at all with his bowels.  No hard stools.  He is not taking Citrucel or Colace.  He had COVID over a month ago and a secondary infection was treated with antibiotics.  He states his symptoms actually got better from the time he was on antibiotics and then has since recurred.  He does use Eucrisa ointment applied to the gluteal cleft and in the perineum as needed for a dermatitis of unclear significance by his dermatologist.  He states his been using this for the past year or so.  He wonders if this ointment is getting into his anal area and potentially causing problems.  This appointment helps with pruritus but has not healed the rash he has never.  Prior workup: Colonoscopy - 01/23/19  - An anal fissure was found on perianal exam. - The terminal ileum appeared normal. - A 3 mm polyp was found in the sigmoid colon. The polyp was flat. The polyp was removed with a cold snare. Resection and retrieval were complete. - HYPERPLASTIC - The colon  was quite tortuous. - Internal hemorrhoids were found during retroflexion. The hemorrhoids were small. - The exam was otherwise without abnormality.   Past Medical History:  Diagnosis Date  . Anal fissure   . Eczema   . Hemorrhoids      Past Surgical History:  Procedure Laterality Date  . APPENDECTOMY  2006   Family History  Problem Relation Age of Onset  . Hypertension Mother   . Hyperlipidemia Mother   . Thyroid disease Mother   . Hypertension Father   . Non-Hodgkin's lymphoma Paternal Uncle    Social History   Tobacco Use  . Smoking status: Never Smoker  . Smokeless tobacco: Never Used  Vaping Use  . Vaping Use: Never used  Substance Use Topics  . Alcohol use: Never  . Drug use: Never   Current Outpatient Medications  Medication Sig Dispense Refill  . Docusate Sodium (COLACE PO) Take 1 tablet by mouth as needed.    . EUCRISA 2 % OINT Apply 1 application topically 2 (two) times daily.    . Multiple Vitamin (MULTIVITAMIN) tablet Take 1 tablet by mouth daily.     No current facility-administered medications for this visit.   No Known Allergies   Review of Systems: All systems reviewed and negative except where noted in HPI.   Lab Results  Component Value Date   WBC 5.6  11/12/2018   HGB 16.6 11/12/2018   HCT 49.2 11/12/2018   MCV 87.8 11/12/2018   PLT 283.0 11/12/2018    No results found for: CREATININE, BUN, NA, K, CL, CO2  Lab Results  Component Value Date   ALT 44 11/12/2018   AST 34 11/12/2018   ALKPHOS 54 11/12/2018   BILITOT 0.6 11/12/2018     Physical Exam: BP 102/60 (BP Location: Left Arm, Patient Position: Sitting, Cuff Size: Normal)   Pulse 68   Ht 5' 6.5" (1.689 m) Comment: height measured without shoes  Wt 176 lb 2 oz (79.9 kg)   BMI 28.00 kg/m  Constitutional: Pleasant,well-developed, male in no acute distress. DRE - rash in gluteal crest that extends down into perianal area with excoriations. ? Small fissure vs involvement of  primary rash. Pain with DRE with drainage of mucous Extremities: no edema Lymphadenopathy: No cervical adenopathy noted. Neurological: Alert and oriented to person place and time. Skin: Skin is warm and dry. No rashes noted. Psychiatric: Normal mood and affect. Behavior is normal.   ASSESSMENT AND PLAN: 25 year old male here for reassessment of the following:  Perianal dermatitis History of anal fissure Anal pain  History of anal fissure that had healed with topical ointment.  Colonoscopy showed no other cause for symptoms previously.  He was doing fine until he developed discharge of mucus from his rectum associated with the pain and occasional bleeding.  He has a dermatitis in his perianal area extending down from his gluteal cleft into the perineum.  I do not know if this is a primary extension of the dermatitis itself or if he has had any reaction from the ointment he is using his perianal area.  He is quite tender on DRE, I think I see a small fissure there and we will treat that with diltiazem ointment, however given his discharge and pain, I think we also need to rule out perianal fistula.  We will order MRI of his pelvis to assess for any significant perianal disease, we will see how he responds to diltiazem ointment.  I offered him empiric antibiotics such as Flagyl as he had benefit with antibiotics recently, however we will hold off until his MRI pelvis is done to unmask anything, I do not see any perirectal abscess on exam etc., and MRI is to be done in the near future.  I also recommend he contact his dermatologist for reassessment, the ointment he is using is clearly not working to heal this dermatitis, and I would like their opinion on this.  If MRI does not show any clear new pathology, we may need to do a flex sig to ensure no proctitis or other pathology that could be causing the symptoms.  He agreed  Plan: - MRI pelvis, rule out fistula - start diltiazem ointment PR TID again -  follow up with dermatology as outlined - discussed empiric antibiotics, will hold off for now as MRI scheduled in the near future - consideration for flex sig pending his course  Call with any worsening pain, fevers, etc, in the interim  [place] Cellar, MD [facility]

## 2021-05-11 NOTE — Patient Instructions (Addendum)
If you are age 24 or older, your body mass index should be between 23-30. Your Body mass index is 28 kg/m. If this is out of the aforementioned range listed, please consider follow up with your Primary Care Provider.  If you are age 91 or younger, your body mass index should be between 19-25. Your Body mass index is 28 kg/m. If this is out of the aformentioned range listed, please consider follow up with your Primary Care Provider.   You have been scheduled for an MRI of Pelvis at [facility] on Thursday, 05-20-21. Your appointment time is 10:00am. Please arrive 30 minutes prior to your appointment time for registration purposes. Please make certain not to have anything to eat or drink 4 hours prior to your test. In addition, if you have any metal in your body, have a pacemaker or defibrillator, please be sure to let your ordering physician know. This test typically takes 45 minutes to 1 hour to complete. Should you need to reschedule, please call [phone] to do so.   We have sent a prescription for Diltiazem Gel gel to [facility]. You should apply a pea size amount to your rectum three times daily x 6 weeks.  [facility]'s information is below: Address: [address] [place], [place] [address]  [phone]  *Please DO NOT go directly from our office to pick up this medication! Give the pharmacy 1 day to process the prescription as this is compounded and takes time to make.   Please follow up with Dermatology.    Thank you for entrusting me with your care and for choosing [facility], Dr. [place] Cellar

## 2021-05-20 ENCOUNTER — Ambulatory Visit (HOSPITAL_COMMUNITY)
Admission: RE | Admit: 2021-05-20 | Discharge: 2021-05-20 | Disposition: A | Discharge status: Home / Self Care | Payer: BC Managed Care – PPO | Source: Ambulatory Visit | Attending: Gastroenterology | Admitting: Gastroenterology

## 2021-05-20 ENCOUNTER — Other Ambulatory Visit: Payer: Self-pay

## 2021-05-20 DIAGNOSIS — L309 Dermatitis, unspecified: Secondary | ICD-10-CM | POA: Diagnosis present

## 2021-05-20 DIAGNOSIS — Z8719 Personal history of other diseases of the digestive system: Secondary | ICD-10-CM | POA: Insufficient documentation

## 2021-05-20 MED ORDER — GADOBUTROL 1 MMOL/ML IV SOLN
8.0000 mL | Freq: Once | INTRAVENOUS | Status: AC | PRN
  Administered 2021-05-20: 8 mL via INTRAVENOUS

## 2021-05-21 ENCOUNTER — Other Ambulatory Visit: Payer: Self-pay | Admitting: Gastroenterology

## 2021-05-21 DIAGNOSIS — K603 Anal fistula: Secondary | ICD-10-CM

## 2021-05-21 MED ORDER — METRONIDAZOLE 500 MG PO TABS: 500.0000 mg | ORAL_TABLET | Freq: Two times a day (BID) | ORAL | 0 refills | Status: AC

## 2021-05-26 ENCOUNTER — Telehealth: Payer: Self-pay | Admitting: Gastroenterology

## 2021-05-26 NOTE — Telephone Encounter (Signed)
[place] we can add Cipro 500 mg twice daily for the next week to the Flagyl, while we await his [facility] appointment. He was already referred there based on MRI findings of a fistula however now needs to be seen more urgently given what you describe, rule out abscess. Not sure if they have any urgent add-on spots in the next day or so to accommodate him and take care of this, I am assuming his routine consult appointment will be for a few weeks yet.  Hopefully they can see him tomorrow if any room, if not and he has worsening pain or fever he may need to go to the ED.  Can you let me know if [facility] colorectal surgery has any openings for him tomorrow or the day after?  Thanks

## 2021-05-26 NOTE — Telephone Encounter (Signed)
Spoke with patient, he states that he has been on Flagyl for about 5 days now. He reports that this past Sunday, 6/5, while in the shower he noticed a lump that formed that was not previously there. He states that the lump is right below the rectum. Pt reports that the lump was slightly sensitive before but pain has progressed over the last few days. Patient thinks that the lump may be growing in size also. He states that it looks like there is a stretch mark over top of it and when he pressed it, it weeped. Describes the drainage as a "yellowish brown color." No fever, he is not sure if this is being masked because he is already on antibiotics. Patient thinks he may have an abscess. Please advise, thanks.

## 2021-05-26 NOTE — Telephone Encounter (Signed)
Inbound call from patient requesting a call from a nurse please.  States symptoms have gotten worse; has a painful bump.  Please advise.

## 2021-05-27 MED ORDER — CIPROFLOXACIN HCL 500 MG PO TABS: 500.0000 mg | ORAL_TABLET | Freq: Two times a day (BID) | ORAL | 0 refills | Status: AC

## 2021-05-27 NOTE — Telephone Encounter (Signed)
Spoke with Jimmy Wells, Triage RN at [facility], she states that the PA who usually sees urgent patient's is out this week. Jimmy Wells states that she is going to check with some of the surgeons to see if they would be willing to work him in either later today or tomorrow. Jimmy Wells will give me a call back with the updated information.   Spoke with patient in regards to Dr. Doyne Keel recommendations. Patient is aware that prescription has been sent in, he requested prescription be sent to [facility] in [place]. Advised patient that if he has any worsening pain or develops a fever he will need to go to the ED for evaluation. Advised patient that I will call him back once I hear back from [facility]. Patient verbalized understanding and had no concerns at the end of the call.

## 2021-05-27 NOTE — Telephone Encounter (Signed)
Received a return call from [place] at [place] [facility], patient has been scheduled to see Dr. Kieth Brightly today at 1 PM. Jimmy Wells has already notified patient of the appt.

## 2021-06-08 ENCOUNTER — Ambulatory Visit: Payer: BC Managed Care – PPO | Admitting: Nurse Practitioner

## 2021-06-08 ENCOUNTER — Encounter: Payer: Self-pay | Admitting: Legal Medicine

## 2021-06-08 ENCOUNTER — Ambulatory Visit: Payer: BC Managed Care – PPO | Admitting: Legal Medicine

## 2021-06-08 ENCOUNTER — Other Ambulatory Visit: Payer: Self-pay

## 2021-06-08 DIAGNOSIS — B370 Candidal stomatitis: Secondary | ICD-10-CM | POA: Diagnosis not present

## 2021-06-08 MED ORDER — FLUCONAZOLE 150 MG PO TABS: 150.0000 mg | ORAL_TABLET | Freq: Every day | ORAL | 0 refills | Status: DC

## 2021-06-08 NOTE — Progress Notes (Deleted)
Acute Office Visit  Subjective:    Patient ID: Jimmy Wells, male    DOB: 1996/03/21, 25 y.o.   MRN: [id number]  No chief complaint on file.   HPI Patient is in today for ***  Past Medical History:  Diagnosis Date   Anal fissure    Eczema    Hemorrhoids     Past Surgical History:  Procedure Laterality Date   APPENDECTOMY  2006    Family History  Problem Relation Age of Onset   Hypertension Mother    Hyperlipidemia Mother    Thyroid disease Mother    Hypertension Father    Non-Hodgkin's lymphoma Paternal Uncle     Social History   Socioeconomic History   Marital status: Single    Spouse name: Not on file   Number of children: Not on file   Years of education: Not on file   Highest education level: Not on file  Occupational History   Occupation: Student    Comment: [employer]  Tobacco Use   Smoking status: Never   Smokeless tobacco: Never  Vaping Use   Vaping Use: Never used  Substance and Sexual Activity   Alcohol use: Never   Drug use: Never   Sexual activity: Not on file  Other Topics Concern   Not on file  Social History Narrative   Not on file   Social Determinants of Health   Financial Resource Strain: Not on file  Food Insecurity: Not on file  Transportation Needs: Not on file  Physical Activity: Not on file  Stress: Not on file  Social Connections: Not on file  Intimate Partner Violence: Not on file    Outpatient Medications Prior to Visit  Medication Sig Dispense Refill   Docusate Sodium (COLACE PO) Take 1 tablet by mouth as needed.     EUCRISA 2 % OINT Apply 1 application topically 2 (two) times daily.     Multiple Vitamin (MULTIVITAMIN) tablet Take 1 tablet by mouth daily.     NONFORMULARY OR COMPOUNDED ITEM Diltiazem 2%: Lidocaine 5% compound - Apply a pea size amount to rectal opening three times daily for 6 weeks. 1 each 1   No facility-administered medications prior to visit.    No Known Allergies  Review of Systems      Objective:    Physical Exam  There were no vitals taken for this visit. Wt Readings from Last 3 Encounters:  05/11/21 176 lb 2 oz (79.9 kg)  07/14/20 175 lb 12.8 oz (79.7 kg)  12/11/19 171 lb 3.2 oz (77.7 kg)    Health Maintenance Due  Topic Date Due   HPV VACCINES (1 - Male 2-dose series) Never done   HIV Screening  Never done   Hepatitis C Screening  Never done   TETANUS/TDAP  Never done       Topic Date Due   HPV VACCINES (1 - Male 2-dose series) Never done     No results found for: TSH Lab Results  Component Value Date   WBC 5.6 11/12/2018   HGB 16.6 11/12/2018   HCT 49.2 11/12/2018   MCV 87.8 11/12/2018   PLT 283.0 11/12/2018   Lab Results  Component Value Date   BILITOT 0.6 11/12/2018   ALKPHOS 54 11/12/2018   AST 34 11/12/2018   ALT 44 11/12/2018   PROT 7.2 11/12/2018   ALBUMIN 4.8 11/12/2018       Assessment & Plan:         Rip Harbour,  NP

## 2021-06-08 NOTE — Progress Notes (Signed)
Established Patient Office Visit  Subjective:  Patient ID: Jimmy Wells, male    DOB: Dec 31, 1995  Age: 25 y.o. MRN: [id number]  CC:  Chief Complaint  Patient presents with   Ritta Slot    HPI Jimmy Wells presents for stomatitis.  He just finished cipro and macrobid for fistula.  Past Medical History:  Diagnosis Date   Anal fissure    Eczema    Hemorrhoids     Past Surgical History:  Procedure Laterality Date   APPENDECTOMY  2006    Family History  Problem Relation Age of Onset   Hypertension Mother    Hyperlipidemia Mother    Thyroid disease Mother    Hypertension Father    Non-Hodgkin's lymphoma Paternal Uncle     Social History   Socioeconomic History   Marital status: Single    Spouse name: Not on file   Number of children: Not on file   Years of education: Not on file   Highest education level: Not on file  Occupational History   Occupation: Student    Comment: [employer]  Tobacco Use   Smoking status: Never   Smokeless tobacco: Never  Vaping Use   Vaping Use: Never used  Substance and Sexual Activity   Alcohol use: Never   Drug use: Never   Sexual activity: Not Currently  Other Topics Concern   Not on file  Social History Narrative   Not on file   Social Determinants of Health   Financial Resource Strain: Not on file  Food Insecurity: Not on file  Transportation Needs: Not on file  Physical Activity: Not on file  Stress: Not on file  Social Connections: Not on file  Intimate Partner Violence: Not on file    Outpatient Medications Prior to Visit  Medication Sig Dispense Refill   Docusate Sodium (COLACE PO) Take 1 tablet by mouth as needed.     EUCRISA 2 % OINT Apply 1 application topically 2 (two) times daily.     ibuprofen (ADVIL) 600 MG tablet Take 600 mg by mouth every 8 (eight) hours as needed.     Multiple Vitamin (MULTIVITAMIN) tablet Take 1 tablet by mouth daily.     NONFORMULARY OR COMPOUNDED ITEM Diltiazem 2%: Lidocaine 5%  compound - Apply a pea size amount to rectal opening three times daily for 6 weeks. 1 each 1   No facility-administered medications prior to visit.    No Known Allergies  ROS Review of Systems  Constitutional:  Negative for chills, fatigue and fever.  HENT:  Negative for congestion, ear pain and sore throat.   Respiratory:  Negative for cough and shortness of breath.   Cardiovascular:  Negative for chest pain.  Gastrointestinal:  Negative for abdominal pain, constipation, diarrhea, nausea and vomiting.  Endocrine: Negative for polydipsia, polyphagia and polyuria.  Genitourinary:  Negative for dysuria and frequency.  Musculoskeletal:  Negative for arthralgias and myalgias.  Neurological:  Negative for dizziness and headaches.  Psychiatric/Behavioral:  Negative for dysphoric mood.        No dysphoria     Objective:    Physical Exam Vitals reviewed.  Constitutional:      Appearance: Normal appearance.  HENT:     Right Ear: Tympanic membrane normal.     Left Ear: Tympanic membrane normal.     Mouth/Throat:     Mouth: Mucous membranes are moist.     Comments: White patches on pharynx, no nodes Eyes:     Extraocular Movements: Extraocular  movements intact.     Conjunctiva/sclera: Conjunctivae normal.     Pupils: Pupils are equal, round, and reactive to light.  Cardiovascular:     Rate and Rhythm: Normal rate and regular rhythm.     Pulses: Normal pulses.     Heart sounds: Normal heart sounds. No murmur heard.   No gallop.  Pulmonary:     Effort: Pulmonary effort is normal.     Breath sounds: Normal breath sounds.  Abdominal:     General: Abdomen is flat. Bowel sounds are normal.     Palpations: Abdomen is soft.  Musculoskeletal:     Cervical back: Normal range of motion and neck supple.  Neurological:     Mental Status: He is alert.    BP 100/60   Pulse 73   Temp 97.6 F (36.4 C)   Resp 16   Ht 5' 6.5" (1.689 m)   Wt 173 lb (78.5 kg)   SpO2 97%   BMI 27.50  kg/m  Wt Readings from Last 3 Encounters:  06/08/21 173 lb (78.5 kg)  05/11/21 176 lb 2 oz (79.9 kg)  07/14/20 175 lb 12.8 oz (79.7 kg)     Health Maintenance Due  Topic Date Due   HPV VACCINES (1 - Male 2-dose series) Never done   HIV Screening  Never done   Hepatitis C Screening  Never done   TETANUS/TDAP  Never done       Topic Date Due   HPV VACCINES (1 - Male 2-dose series) Never done    No results found for: TSH Lab Results  Component Value Date   WBC 5.6 11/12/2018   HGB 16.6 11/12/2018   HCT 49.2 11/12/2018   MCV 87.8 11/12/2018   PLT 283.0 11/12/2018   Lab Results  Component Value Date   BILITOT 0.6 11/12/2018   ALKPHOS 54 11/12/2018   AST 34 11/12/2018   ALT 44 11/12/2018   PROT 7.2 11/12/2018   ALBUMIN 4.8 11/12/2018   No results found for: CHOL No results found for: HDL No results found for: LDLCALC No results found for: TRIG No results found for: CHOLHDL No results found for: HGBA1C    Assessment & Plan:   Diagnoses and all orders for this visit: Candidal stomatitis -     fluconazole (DIFLUCAN) 150 MG tablet; Take 1 tablet (150 mg total) by mouth daily.  Patient has candida stomatitis from recent antibiotics    Follow-up: Return if symptoms worsen or fail to improve.    Reinaldo Meeker, MD

## 2021-08-06 ENCOUNTER — Telehealth: Payer: Self-pay | Admitting: Gastroenterology

## 2021-08-06 NOTE — Telephone Encounter (Signed)
Spoke with patient, he would like a referral to Dr. Yetta Barre. Waters at [place] [facility] for a second opinion of his anal fissure. Please advise, thanks.

## 2021-08-06 NOTE — Telephone Encounter (Signed)
He has seen Dr. Johney Maine fairly recently and I reviewed his records which stated he was doing better.  If the patient has recurrent symptoms and wants to have another opinion we can refer him to Dr. Morton Stall if that is his preference, if you can help with that. Thanks

## 2021-08-06 NOTE — Telephone Encounter (Signed)
Inbound call from patient. Requesting a referral to Jimmy Wells colorectal surgery for his fissure.

## 2021-08-09 NOTE — Telephone Encounter (Signed)
Referral, records, demographic and insurance information has been faxed to Dr. Cheryll Cockayne at [facility] (P: [phone], F: [phone]). Spoke with patient and he is aware, advised patient to let us know if he has not heard from Dr. Morton Stall in 2 weeks. Patient verbalized understanding and had no concerns at the end of the call.

## 2022-02-02 ENCOUNTER — Other Ambulatory Visit: Payer: Self-pay

## 2022-02-02 ENCOUNTER — Ambulatory Visit: Payer: BC Managed Care – PPO | Admitting: Family Medicine

## 2022-02-02 VITALS — BP 108/60 | HR 68 | Temp 96.9°F | Resp 16 | Ht 67.0 in | Wt 173.0 lb

## 2022-02-02 DIAGNOSIS — I808 Phlebitis and thrombophlebitis of other sites: Secondary | ICD-10-CM

## 2022-02-02 MED ORDER — NAPROXEN 500 MG PO TABS: 500.0000 mg | ORAL_TABLET | Freq: Two times a day (BID) | ORAL | 0 refills | Status: DC

## 2022-02-02 NOTE — Patient Instructions (Signed)
Start naproxen 500 mg twice daily  Follow up in 2 weeks if not improing.

## 2022-02-02 NOTE — Progress Notes (Signed)
Acute Office Visit  Subjective:    Patient ID: Jimmy Wells, male    DOB: [birth year], 26 y.o.   MRN: [id number]  Chief Complaint  Patient presents with   Mass    Left forearm     HPI: Patient is in today for a palpable lump left forearm.  He reports that he noticed the lump on Sunday.  He reports no injury to the area but he does work-out on a regular basis.  It is tender. It hurt down to hand and up to elbow.   Past Medical History:  Diagnosis Date   Anal fissure    Eczema    Hemorrhoids     Past Surgical History:  Procedure Laterality Date   APPENDECTOMY  2006    Family History  Problem Relation Age of Onset   Hypertension Mother    Hyperlipidemia Mother    Thyroid disease Mother    Hypertension Father    Non-Hodgkin's lymphoma Paternal Uncle     Social History   Socioeconomic History   Marital status: Single    Spouse name: Not on file   Number of children: Not on file   Years of education: Not on file   Highest education level: Not on file  Occupational History   Occupation: Student    Comment: [employer]  Tobacco Use   Smoking status: Never   Smokeless tobacco: Never  Vaping Use   Vaping Use: Never used  Substance and Sexual Activity   Alcohol use: Never   Drug use: Never   Sexual activity: Not Currently  Other Topics Concern   Not on file  Social History Narrative   Not on file   Social Determinants of Health   Financial Resource Strain: Not on file  Food Insecurity: Not on file  Transportation Needs: Not on file  Physical Activity: Not on file  Stress: Not on file  Social Connections: Not on file  Intimate Partner Violence: Not on file    Outpatient Medications Prior to Visit  Medication Sig Dispense Refill   VITAMIN B COMPLEX-C PO Take by mouth.     Multiple Vitamin (MULTIVITAMIN) tablet Take 1 tablet by mouth daily.     Docusate Sodium (COLACE PO) Take 1 tablet by mouth as needed.     EUCRISA 2 % OINT Apply 1 application topically  2 (two) times daily.     fluconazole (DIFLUCAN) 150 MG tablet Take 1 tablet (150 mg total) by mouth daily. 7 tablet 0   ibuprofen (ADVIL) 600 MG tablet Take 600 mg by mouth every 8 (eight) hours as needed.     No facility-administered medications prior to visit.    No Known Allergies  Review of Systems  Constitutional:  Negative for chills and fever.  HENT:  Negative for congestion.   Respiratory:  Negative for cough and shortness of breath.   Cardiovascular:  Negative for chest pain.  Musculoskeletal:  Negative for arthralgias.  Skin:        Mass left forearm.      Objective:    Physical Exam Vitals reviewed.  Constitutional:      Appearance: Normal appearance.  Cardiovascular:     Rate and Rhythm: Normal rate and regular rhythm.     Heart sounds: Normal heart sounds.  Pulmonary:     Effort: Pulmonary effort is normal.     Breath sounds: Normal breath sounds. No wheezing, rhonchi or rales.  Skin:    Comments: Left anterior forearm showed hard area  in superficial vein. Hand and elbow left hand nontender. No erythema.   Neurological:     Mental Status: He is alert.  Psychiatric:        Mood and Affect: Mood normal.        Behavior: Behavior normal.    BP 108/60    Pulse 68    Temp (!) 96.9 F (36.1 C)    Resp 16    Ht 5\' 7"  (1.702 m)    Wt 173 lb (78.5 kg)    BMI 27.10 kg/m  Wt Readings from Last 3 Encounters:  02/02/22 173 lb (78.5 kg)  06/08/21 173 lb (78.5 kg)  05/11/21 176 lb 2 oz (79.9 kg)    Health Maintenance Due  Topic Date Due   HPV VACCINES (1 - Male 2-dose series) Never done   HIV Screening  Never done   Hepatitis C Screening  Never done   TETANUS/TDAP  Never done   INFLUENZA VACCINE  Never done       Topic Date Due   HPV VACCINES (1 - Male 2-dose series) Never done     No results found for: TSH Lab Results  Component Value Date   WBC 5.6 11/12/2018   HGB 16.6 11/12/2018   HCT 49.2 11/12/2018   MCV 87.8 11/12/2018   PLT 283.0 11/12/2018    Lab Results  Component Value Date   BILITOT 0.6 11/12/2018   ALKPHOS 54 11/12/2018   AST 34 11/12/2018   ALT 44 11/12/2018   PROT 7.2 11/12/2018   ALBUMIN 4.8 11/12/2018   No results found for: CHOL No results found for: HDL No results found for: LDLCALC No results found for: TRIG No results found for: CHOLHDL No results found for: HGBA1C     Assessment & Plan:   Problem List Items Addressed This Visit       Cardiovascular and Mediastinum   Thrombophlebitis arm - Primary    Naproxen. Ice.  Education given.  If not improving or worsening, patient should call for recheck in 1-2 wks.      Meds ordered this encounter  Medications   naproxen (NAPROSYN) 500 MG tablet    Sig: Take 1 tablet (500 mg total) by mouth 2 (two) times daily with a meal.    Dispense:  60 tablet    Refill:  0    No orders of the defined types were placed in this encounter.    Follow-up: No follow-ups on file.  An After Visit Summary was printed and given to the patient.  Rochel Brome, MD [facility] [phone]

## 2022-02-03 ENCOUNTER — Encounter: Payer: Self-pay | Admitting: Family Medicine

## 2022-02-03 DIAGNOSIS — I808 Phlebitis and thrombophlebitis of other sites: Secondary | ICD-10-CM | POA: Insufficient documentation

## 2022-02-03 NOTE — Assessment & Plan Note (Signed)
Naproxen. Ice.  Education given.  If not improving or worsening, patient should call for recheck in 1-2 wks.

## 2022-02-10 NOTE — Progress Notes (Signed)
Acute Office Visit  Subjective:    Patient ID: Jimmy Wells, male    DOB: 11-13-1996, 26 y.o.   MRN: [id number]  Chief Complaint  Patient presents with   Lump under arm     Now bigger    HPI Patient is in today for tender lump on left lower arm. Increased size. Still tender. Present x 2-3 weeks now. Ultrasound done today was normal, and showed no blood clot.  Past Medical History:  Diagnosis Date   Anal fissure    Eczema    Hemorrhoids     Past Surgical History:  Procedure Laterality Date   APPENDECTOMY  2006    Family History  Problem Relation Age of Onset   Hypertension Mother    Hyperlipidemia Mother    Thyroid disease Mother    Hypertension Father    Non-Hodgkin's lymphoma Paternal Uncle     Social History   Socioeconomic History   Marital status: Single    Spouse name: Not on file   Number of children: Not on file   Years of education: Not on file   Highest education level: Not on file  Occupational History   Occupation: Student    Comment: [employer]  Tobacco Use   Smoking status: Never   Smokeless tobacco: Never  Vaping Use   Vaping Use: Never used  Substance and Sexual Activity   Alcohol use: Never   Drug use: Never   Sexual activity: Not Currently  Other Topics Concern   Not on file  Social History Narrative   Not on file   Social Determinants of Health   Financial Resource Strain: Not on file  Food Insecurity: Not on file  Transportation Needs: Not on file  Physical Activity: Not on file  Stress: Not on file  Social Connections: Not on file  Intimate Partner Violence: Not on file    Outpatient Medications Prior to Visit  Medication Sig Dispense Refill   Multiple Vitamin (MULTIVITAMIN) tablet Take 1 tablet by mouth daily.     naproxen (NAPROSYN) 500 MG tablet Take 1 tablet (500 mg total) by mouth 2 (two) times daily with a meal. 60 tablet 0   VITAMIN B COMPLEX-C PO Take by mouth.     No facility-administered medications prior to  visit.    No Known Allergies  Review of Systems  Constitutional:  Negative for appetite change, fatigue and fever.  HENT:  Negative for congestion, ear pain and sore throat.   Respiratory:  Negative for cough and shortness of breath.   Cardiovascular:  Negative for chest pain and leg swelling.  Gastrointestinal:  Negative for abdominal pain, constipation, diarrhea, nausea and vomiting.  Genitourinary:  Negative for dysuria and frequency.  Musculoskeletal:  Negative for arthralgias and myalgias.  Skin:        Lump on for arm bigger   Neurological:  Negative for dizziness and headaches.  Psychiatric/Behavioral:  Negative for dysphoric mood. The patient is not nervous/anxious.       Objective:    Physical Exam Vitals reviewed.  Constitutional:      Appearance: Normal appearance. He is normal weight.  Eyes:     Pupils: Pupils are equal, round, and reactive to light.  Abdominal:     General: Abdomen is flat. Bowel sounds are normal.     Palpations: Abdomen is soft.  Skin:    Comments: Left arm proximally. Nodule tender 3 cm x 1.5 cm. No erythema.   Neurological:     Mental  Status: He is alert and oriented to person, place, and time.  Psychiatric:        Mood and Affect: Mood normal.        Behavior: Behavior normal.    BP 122/68 (BP Location: Right Arm, Patient Position: Sitting)    Pulse 61    Temp 97.8 F (36.6 C) (Oral)    Ht 5\' 7"  (1.702 m)    Wt 178 lb (80.7 kg)    SpO2 98%    BMI 27.88 kg/m  Wt Readings from Last 3 Encounters:  02/11/22 178 lb (80.7 kg)  02/02/22 173 lb (78.5 kg)  06/08/21 173 lb (78.5 kg)    Health Maintenance Due  Topic Date Due   HPV VACCINES (1 - Male 2-dose series) Never done   HIV Screening  Never done   Hepatitis C Screening  Never done   TETANUS/TDAP  Never done   INFLUENZA VACCINE  Never done       Topic Date Due   HPV VACCINES (1 - Male 2-dose series) Never done     No results found for: TSH Lab Results  Component Value Date    WBC 5.6 11/12/2018   HGB 16.6 11/12/2018   HCT 49.2 11/12/2018   MCV 87.8 11/12/2018   PLT 283.0 11/12/2018   Lab Results  Component Value Date   BILITOT 0.6 11/12/2018   ALKPHOS 54 11/12/2018   AST 34 11/12/2018   ALT 44 11/12/2018   PROT 7.2 11/12/2018   ALBUMIN 4.8 11/12/2018   No results found for: CHOL No results found for: HDL No results found for: LDLCALC No results found for: TRIG No results found for: CHOLHDL No results found for: HGBA1C       Assessment & Plan:   Problem List Items Addressed This Visit       Cardiovascular and Mediastinum   Thrombophlebitis arm - Primary    Ultrasound left arm was ordered and was negative .  Believe the place is either a cyst or benign lipoma   If place continues to bother patient we will refer to General Surgery for removal.      Relevant Orders   VAS [place] UPPER EXTREMITY VENOUS DUPLEX (Completed)    I,Lauren M Auman,acting as a scribe for Rochel Brome, MD.,have documented all relevant documentation on the behalf of Rochel Brome, MD,as directed by  Rochel Brome, MD while in the presence of Rochel Brome, MD.   Rochel Brome, MD

## 2022-02-11 ENCOUNTER — Encounter: Payer: Self-pay | Admitting: Family Medicine

## 2022-02-11 ENCOUNTER — Ambulatory Visit: Payer: BC Managed Care – PPO | Admitting: Family Medicine

## 2022-02-11 ENCOUNTER — Ambulatory Visit (HOSPITAL_COMMUNITY)
Admission: RE | Admit: 2022-02-11 | Discharge: 2022-02-11 | Disposition: A | Discharge status: Home / Self Care | Payer: BC Managed Care – PPO | Source: Ambulatory Visit | Attending: Family Medicine | Admitting: Family Medicine

## 2022-02-11 ENCOUNTER — Other Ambulatory Visit: Payer: Self-pay

## 2022-02-11 VITALS — BP 122/68 | HR 61 | Temp 97.8°F | Ht 67.0 in | Wt 178.0 lb

## 2022-02-11 DIAGNOSIS — I808 Phlebitis and thrombophlebitis of other sites: Secondary | ICD-10-CM | POA: Diagnosis present

## 2022-02-11 NOTE — Assessment & Plan Note (Addendum)
Ultrasound left arm was ordered and was negative .  Believe the place is either a cyst or benign lipoma   If place continues to bother patient we will refer to General Surgery for removal.

## 2022-02-11 NOTE — Progress Notes (Signed)
Upper extremity venous has been completed.   Preliminary results in CV Proc.   Rabon Scholle Metztli Sachdev 02/11/2022 10:20 AM

## 2022-08-29 ENCOUNTER — Encounter: Payer: Self-pay | Admitting: Podiatry

## 2022-08-29 ENCOUNTER — Ambulatory Visit: Payer: BC Managed Care – PPO | Admitting: Podiatry

## 2022-08-29 DIAGNOSIS — L03032 Cellulitis of left toe: Secondary | ICD-10-CM | POA: Diagnosis not present

## 2022-08-29 DIAGNOSIS — L600 Ingrowing nail: Secondary | ICD-10-CM

## 2022-08-29 NOTE — Patient Instructions (Signed)

## 2022-08-29 NOTE — Progress Notes (Signed)
Subjective:   Patient ID: Jimmy Wells, male   DOB: 26 y.o.   MRN: [id number]   HPI Patient presents stating his left big toe is sore and it was infected has been on antibiotics it is improved but he does have chronic ingrown toenail with family history.  Patient does not smoke likes to be active   Review of Systems  All other systems reviewed and are negative.       Objective:  Physical Exam Vitals and nursing note reviewed.  Constitutional:      Appearance: He is well-developed.  Pulmonary:     Effort: Pulmonary effort is normal.  Musculoskeletal:        General: Normal range of motion.  Skin:    General: Skin is warm.  Neurological:     Mental Status: He is alert.     Neurovascular status intact muscle strength was found to be adequate range of motion adequate with patient found to have incurvated medial border of the left hallux localized no erythema and no current drainage noted mild to moderate discomfort associated with it.  Good digital perfusion well-oriented     Assessment:  Ingrown toenail deformity left hallux with history of infection that appears resolved with discomfort     Plan:  H&P reviewed condition discussed treatment options and I do think long-term probable permanent nail procedure would be of benefit but they do not want this and wanted temporary procedure and I went ahead I anesthetized 60 mg like Marcaine mixture sterile prep done and went ahead and remove the medial border flush the bed out did not note any active drainage applied sterile dressing and instructed on soaks and if symptoms were to come back we will need to do permanent procedure

## 2023-04-30 IMAGING — MR MR PELVIS WO/W CM
5 of 8 series · 29 of 48 positions shown · IV contrast (gadavist)
Comparison: CT pelvis 07/18/2005

CLINICAL DATA: Anal fissure and perianal dermatitis. Assessment for
perianal fistula.

EXAM:
MRI PELVIS WITHOUT AND WITH CONTRAST
TECHNIQUE: Multiplanar multisequence MR imaging of the pelvis was performed
both before and after administration of intravenous contrast.
CONTRAST:  8mL GADAVIST GADOBUTROL 1 MMOL/ML IV SOLN

[Series 2: T2 · sagittal · 2.5mm · 1.02mm/px · 7 of 50 slices shown (1 of 2)]
[im 1/50]
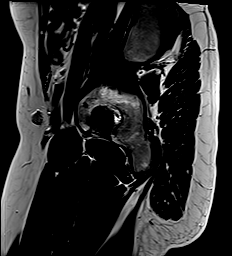
[im 9/50]
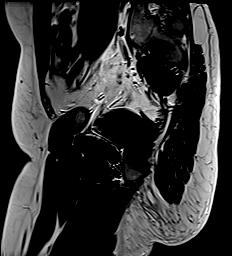
[im 17/50]
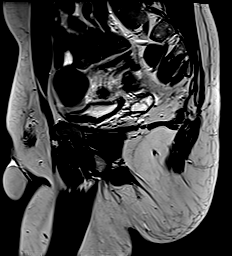
[im 25/50]
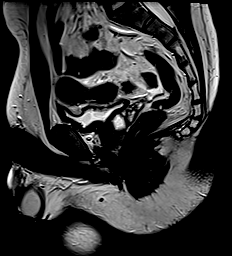
[im 33/50]
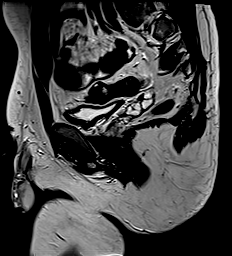
[im 41/50]
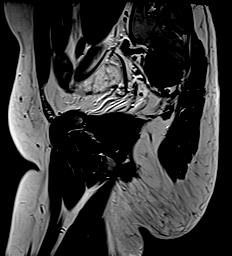
[im 50/50]
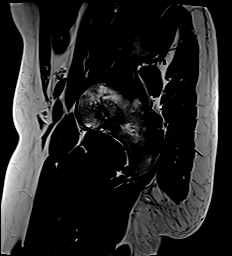

[Series 3: T2 fat-sat · sagittal · 2.5mm · 1.02mm/px · 7 of 50 slices shown (1 of 2)]
[im 1/50]
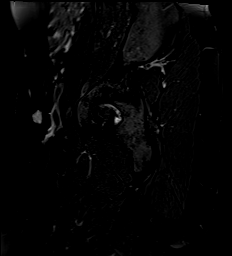
[im 9/50]
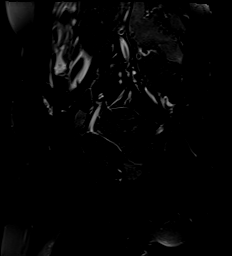
[im 17/50]
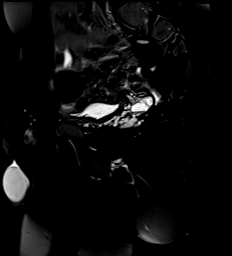
[im 25/50]
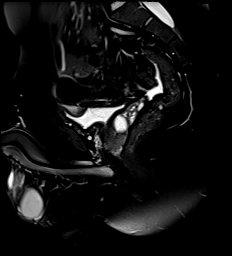
[im 33/50]
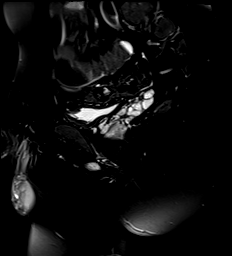
[im 41/50]
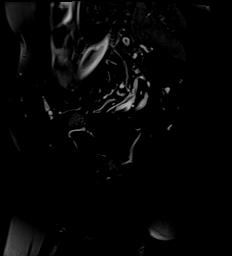
[im 50/50]
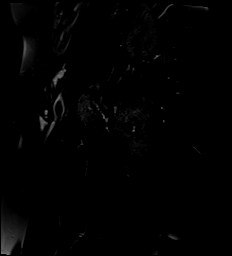

[Series 4: T1 · axial · 4.0mm · 0.43mm/px · z∈[-159,+15]mm · 6 of 39 slices shown]
[im 1/39]
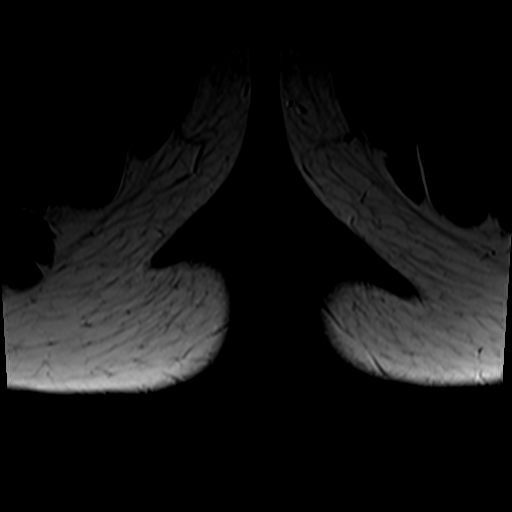
[im 8/39]
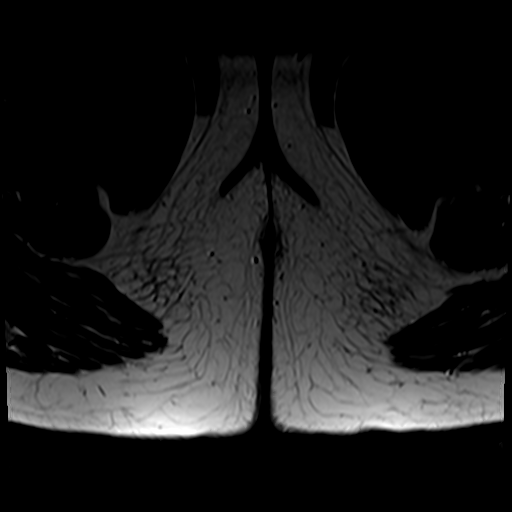
[im 16/39]
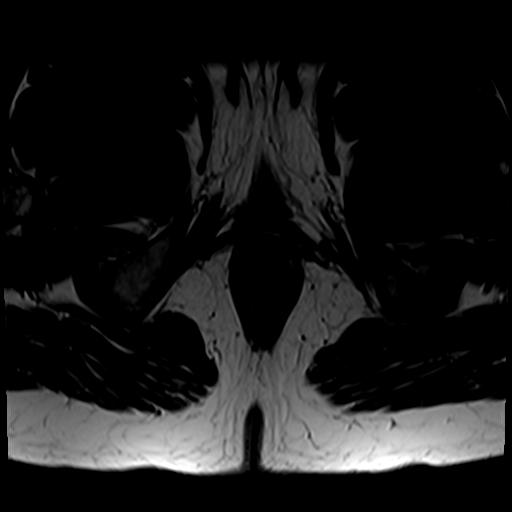
[im 23/39]
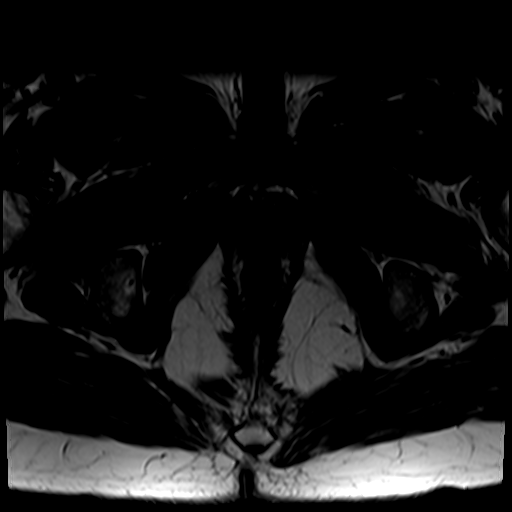
[im 31/39]
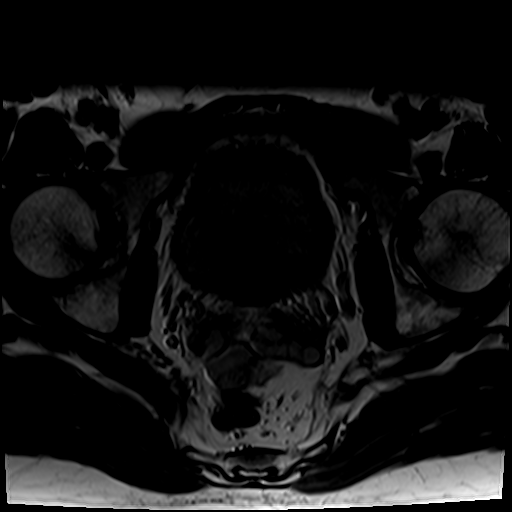
[im 39/39]
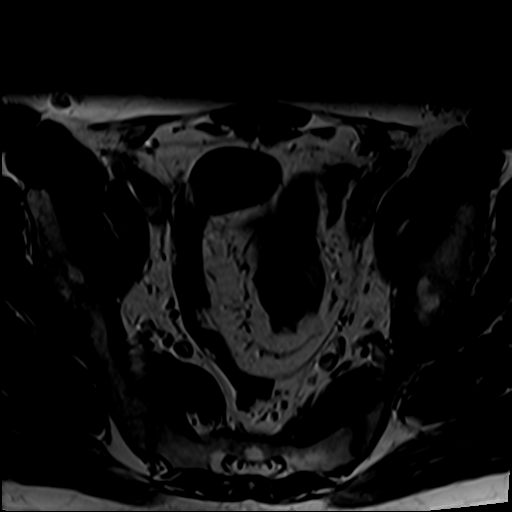

[Series 5: T2 · axial · 4.0mm · 0.43mm/px · z∈[-159,+15]mm · 6 of 39 slices shown (2 of 2)]
[im 1/39]
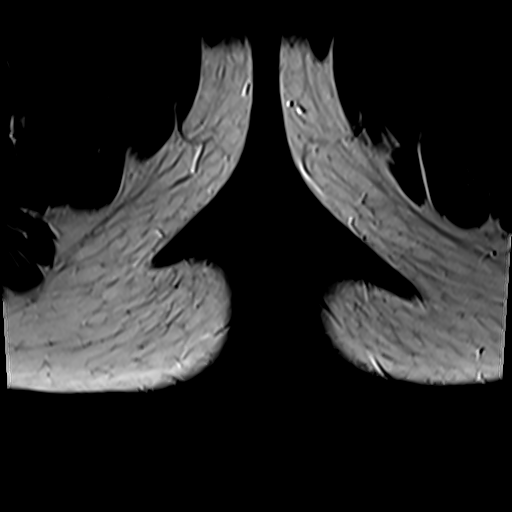
[im 8/39]
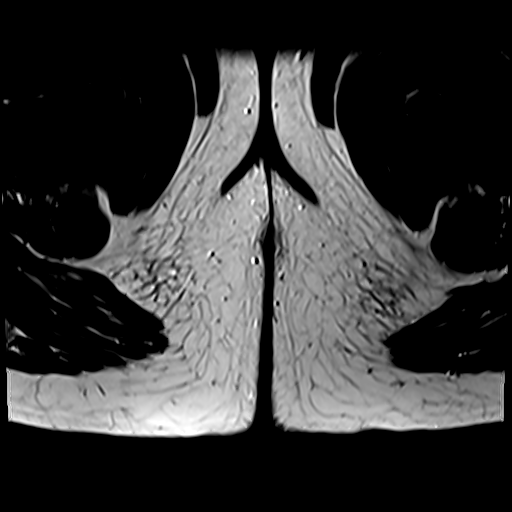
[im 16/39]
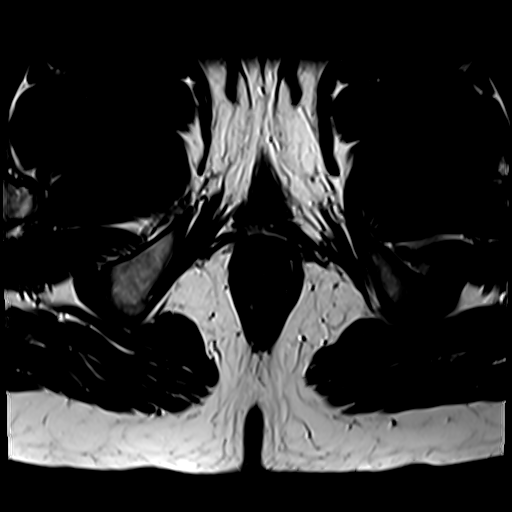
[im 23/39]
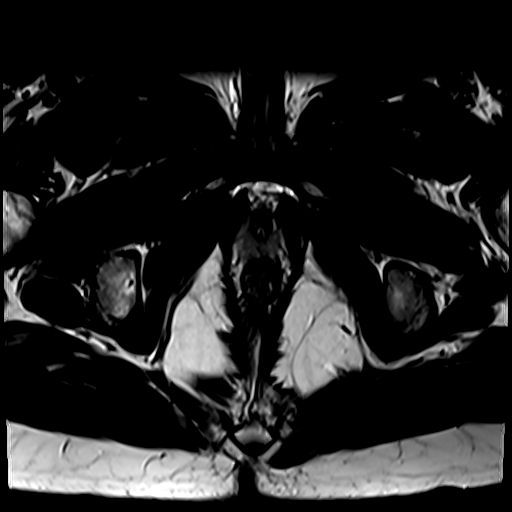
[im 31/39]
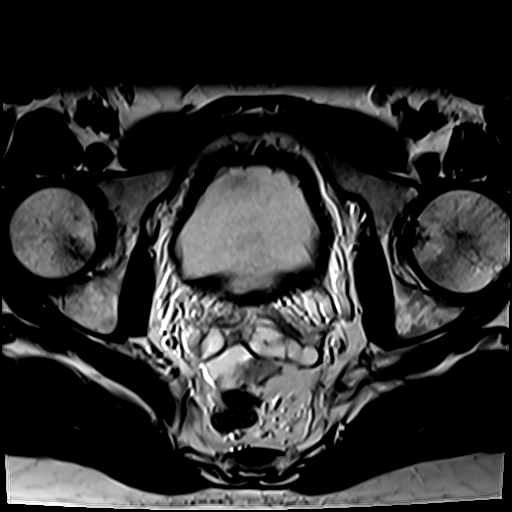
[im 39/39]
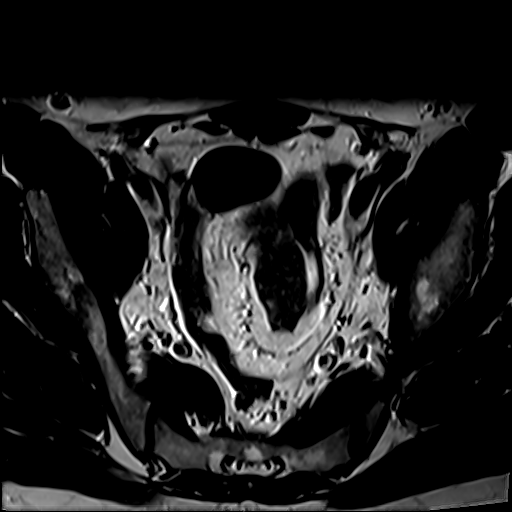

[Series 6: T2 fat-sat · axial · 4.0mm · 0.43mm/px · z∈[-159,-90]mm · 3 of 39 slices shown (2 of 2)]
[im 1/39]
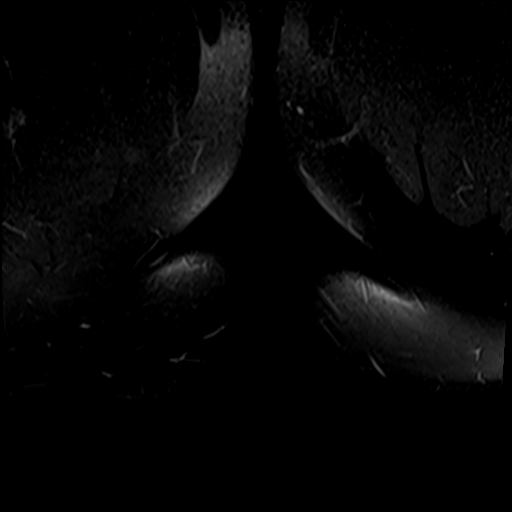
[im 8/39]
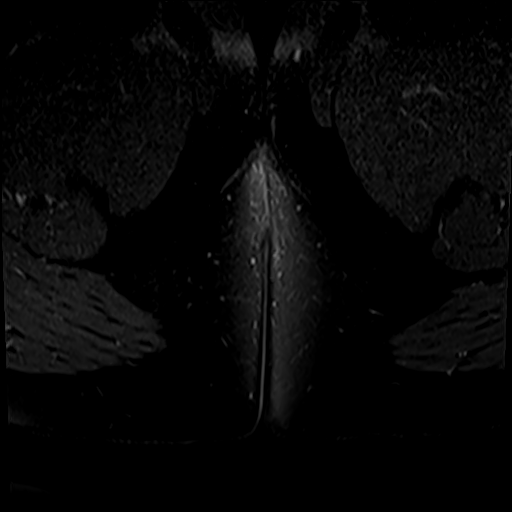
[im 16/39]
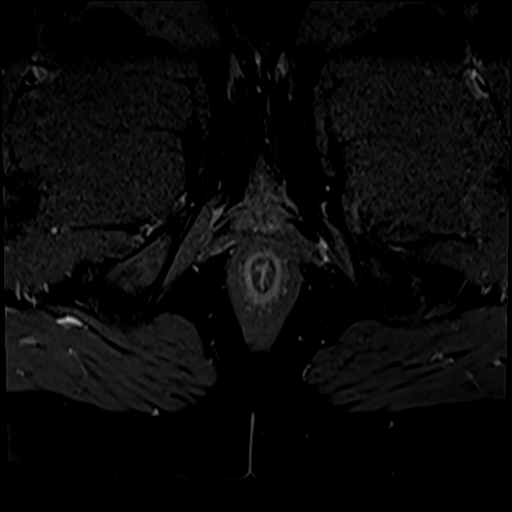

[29 of 48 positions shown; findings below may reference images not displayed]

FINDINGS: Anorectal region: Along the lower anal margin near the anal verge,
and arising from the 12 o'clock position, Lashaun Conde grade 1 linear
intersphincteric fistula tracks down to the cutaneous surface about
the 1 o'clock position. Because of the low origination of this
fistula, the length is only about 1.4 cm. No overt abscess is
identified. The fistula is shown on images 26 through 31 of series
6.

Reproductive: Midline 1.4 by 1.3 by 2.0 cm cyst along the
posterosuperior prostate gland favoring prostatic utricle cyst or
mullerian duct cyst.

Lower urinary tract: The bladder appears unremarkable.

Other: Small nonspecific amount of free pelvic fluid for example on
image 8 series 6.

Musculoskeletal: Unremarkable
IMPRESSION: 1. There is 1.4 cm in length Sabu grade 1 linear intersphincteric
perianal fistula tracking from the lower anal 12 o'clock position
down in the 1 o'clock position to the cutaneous surface.
2. Midline prostatic cyst favoring prostatic utricle cyst or less
likely Mullerian duct cyst.
3. Small nonspecific amount of free pelvic fluid.

## 2024-04-24 ENCOUNTER — Ambulatory Visit (INDEPENDENT_AMBULATORY_CARE_PROVIDER_SITE_OTHER): Admitting: Podiatry

## 2024-04-24 DIAGNOSIS — L0889 Other specified local infections of the skin and subcutaneous tissue: Secondary | ICD-10-CM | POA: Diagnosis not present

## 2024-04-24 DIAGNOSIS — L600 Ingrowing nail: Secondary | ICD-10-CM

## 2024-04-24 MED ORDER — DOXYCYCLINE HYCLATE 100 MG PO TABS: 100.0000 mg | ORAL_TABLET | Freq: Two times a day (BID) | ORAL | 0 refills | Status: AC

## 2024-04-24 NOTE — Progress Notes (Signed)
  Subjective:  Patient ID: Jimmy Wells, male    DOB: 1996/06/06,  MRN: [id number]  Chief Complaint  Patient presents with   Ingrown Toenail    Right hallux ingrown     28 y.o. male presents with the above complaint.  Patient presents with right hallux medial border ingrown painful to touch is progressive gotten worse with some erythema.  He is not currently taking antibiotics he would like to have the ingrown removed he does not want to make it permanent denies any other acute issues pain scale is 5 out of 10 dull aching nature   Review of Systems: Negative except as noted in the HPI. Denies N/V/F/Ch.  Past Medical History:  Diagnosis Date   Anal fissure    Eczema    Hemorrhoids     Current Outpatient Medications:    clindamycin (CLEOCIN) 300 MG capsule, Take 300 mg by mouth 3 (three) times daily., Disp: , Rfl:    ibuprofen (ADVIL) 600 MG tablet, Take 600 mg by mouth 3 (three) times daily., Disp: , Rfl:    Multiple Vitamin (MULTIVITAMIN) tablet, Take 1 tablet by mouth daily., Disp: , Rfl:    mupirocin ointment (BACTROBAN) 2 %, Apply topically 3 (three) times daily., Disp: , Rfl:   Social History   Tobacco Use  Smoking Status Never  Smokeless Tobacco Never    No Known Allergies Objective:  There were no vitals filed for this visit. There is no height or weight on file to calculate BMI. Constitutional Well developed. Well nourished.  Vascular Dorsalis pedis pulses palpable bilaterally. Posterior tibial pulses palpable bilaterally. Capillary refill normal to all digits.  No cyanosis or clubbing noted. Pedal hair growth normal.  Neurologic Normal speech. Oriented to person, place, and time. Epicritic sensation to light touch grossly present bilaterally.  Dermatologic Painful ingrowing nail at medial nail borders of the hallux nail right. No other open wounds. No skin lesions.  Orthopedic: Normal joint ROM without pain or crepitus bilaterally. No visible  deformities. No bony tenderness.   Radiographs: None Assessment:  No diagnosis found. Plan:  Patient was evaluated and treated and all questions answered.  Ingrown Nail, right without phenol matricectomy with underlying erythema -Patient elects to proceed with minor surgery to remove ingrown toenail removal today. Consent reviewed and signed by patient. -Ingrown nail excised. See procedure note. -Educated on post-procedure care including soaking. Written instructions provided and reviewed. -Patient to follow up in 2 weeks for nail check. - Doxycycline was dispensed for underlying erythema  Procedure: Excision of Ingrown Toenail Location: Right 1st toe medial nail borders. Anesthesia: Lidocaine 1% plain; 1.5 mL and Marcaine 0.5% plain; 1.5 mL, digital block. Skin Prep: Betadine. Dressing: Silvadene; telfa; dry, sterile, compression dressing. Technique: Following skin prep, the toe was exsanguinated and a tourniquet was secured at the base of the toe. The affected nail border was freed, split with a nail splitter, and excised.  No phenol matricectomy was performed the tourniquet was then removed and sterile dressing applied. Disposition: Patient tolerated procedure well. Patient to return in 2 weeks for follow-up.   No follow-ups on file.
